# Patient Record
Sex: Male | Born: 1980 | Race: White | Hispanic: No | Marital: Single | Smoking: Never smoker
Health system: Southern US, Community
[De-identification: ages and names within clinical notes are randomized; demographics above are authoritative.]

---

## 2005-08-31 ENCOUNTER — Ambulatory Visit: Payer: Self-pay | Admitting: Internal Medicine

## 2015-01-27 ENCOUNTER — Ambulatory Visit (INDEPENDENT_AMBULATORY_CARE_PROVIDER_SITE_OTHER): Payer: BC Managed Care – PPO | Admitting: Family Medicine

## 2015-01-27 ENCOUNTER — Ambulatory Visit (INDEPENDENT_AMBULATORY_CARE_PROVIDER_SITE_OTHER): Payer: BC Managed Care – PPO

## 2015-01-27 VITALS — BP 112/76 | HR 82 | Temp 97.9°F | Resp 18 | Ht 66.0 in | Wt 208.2 lb

## 2015-01-27 DIAGNOSIS — R05 Cough: Secondary | ICD-10-CM | POA: Diagnosis not present

## 2015-01-27 DIAGNOSIS — R059 Cough, unspecified: Secondary | ICD-10-CM

## 2015-01-27 DIAGNOSIS — J189 Pneumonia, unspecified organism: Secondary | ICD-10-CM | POA: Diagnosis not present

## 2015-01-27 LAB — POCT CBC
Granulocyte percent: 64.4 %G (ref 37–80)
HCT, POC: 45.9 % (ref 43.5–53.7)
HEMOGLOBIN: 14.8 g/dL (ref 14.1–18.1)
LYMPH, POC: 1.4 (ref 0.6–3.4)
MCH, POC: 28.3 pg (ref 27–31.2)
MCHC: 32.1 g/dL (ref 31.8–35.4)
MCV: 88 fL (ref 80–97)
MID (cbc): 0.4 (ref 0–0.9)
MPV: 8.4 fL (ref 0–99.8)
POC GRANULOCYTE: 3.1 (ref 2–6.9)
POC LYMPH PERCENT: 28.3 %L (ref 10–50)
POC MID %: 7.3 %M (ref 0–12)
Platelet Count, POC: 238 10*3/uL (ref 142–424)
RBC: 5.22 M/uL (ref 4.69–6.13)
RDW, POC: 13.7 %
WBC: 4.8 10*3/uL (ref 4.6–10.2)

## 2015-01-27 MED ORDER — AZITHROMYCIN 500 MG PO TABS: 500.0000 mg | ORAL_TABLET | Freq: Every day | ORAL | Status: DC

## 2015-01-27 MED ORDER — HYDROCOD POLST-CPM POLST ER 10-8 MG/5ML PO SUER: 5.0000 mL | Freq: Every evening | ORAL | Status: DC | PRN

## 2015-01-27 MED ORDER — ALBUTEROL SULFATE 108 (90 BASE) MCG/ACT IN AEPB: 2.0000 | INHALATION_SPRAY | RESPIRATORY_TRACT | Status: DC | PRN

## 2015-01-27 MED ORDER — ALBUTEROL SULFATE (2.5 MG/3ML) 0.083% IN NEBU
2.5000 mg | INHALATION_SOLUTION | Freq: Once | RESPIRATORY_TRACT | Status: AC
  Administered 2015-01-27: 2.5 mg via RESPIRATORY_TRACT

## 2015-01-27 MED ORDER — IPRATROPIUM BROMIDE 0.02 % IN SOLN
0.5000 mg | Freq: Once | RESPIRATORY_TRACT | Status: AC
  Administered 2015-01-27: 0.5 mg via RESPIRATORY_TRACT

## 2015-01-27 MED ORDER — MUCINEX DM MAXIMUM STRENGTH 60-1200 MG PO TB12: 1.0000 | ORAL_TABLET | Freq: Two times a day (BID) | ORAL | Status: DC

## 2015-01-27 NOTE — Patient Instructions (Signed)

## 2015-01-27 NOTE — Progress Notes (Signed)
Subjective:  This chart was scribed for Todd Sorenson MD,  by Veverly Fells, at [facility]  This patient was seen in room 11 and the patient's care was started at 10:23 AM.    Patient ID: Todd Sweeney, male    DOB: 08/16/1980, 34 y.o.   MRN: [id number] Chief Complaint  Patient presents with  . Cough    expelled   . Chest Pain    pressure from cough     HPI  HPI Comments: Todd Sweeney is a 34 y.o. male who presents to the [facility] complaining of a productive cough (yellow phlegm)  onset 8 days ago with associated symptoms of chest pain and a fever (no longer present) .  Patient notes that his symptoms first started in his chest.  Patients dad smokes at home and patient used to smoke a long time ago but no longer does.  He has tried mucin ex and sudafed but denies any relief.  Patient does not have a history of asthma.  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     History reviewed. No pertinent past medical history.  No current outpatient prescriptions on file prior to visit.   No current facility-administered medications on file prior to visit.    No Known Allergies   Review of Systems  Constitutional: Positive for fever.  HENT: Negative for hearing loss and nosebleeds.   Eyes: Negative for pain, discharge and itching.  Respiratory: Positive for cough and wheezing.   Cardiovascular: Positive for chest pain.  Gastrointestinal: Negative for nausea and vomiting.  Musculoskeletal: Negative for gait problem, neck pain and neck stiffness.  Skin: Negative for rash.  Neurological: Negative for speech difficulty.       Objective:   Physical Exam  Constitutional: He is oriented to person, place, and time. He appears well-developed and well-nourished. No distress.  HENT:  Head: Normocephalic and atraumatic.  Both ears with retraction, erythema.   Eyes: No scleral icterus.  Neck: No thyromegaly present.    Pulmonary/Chest: Effort normal.  Left lung with a very faint expiratory wheeze Right lung is normal.   Lymphadenopathy:    He has no cervical adenopathy.  Neurological: He is alert and oriented to person, place, and time.  Skin: Skin is warm and dry. He is not diaphoretic.  Clammy and diaphoretic.   Psychiatric: He has a normal mood and affect. His behavior is normal.    Filed Vitals:   01/27/15 1007  BP: 112/76  Pulse: 82  Temp: 97.9 F (36.6 C)  TempSrc: Oral  Resp: 18  Height: 5\' 6"  (1.676 m)  Weight: 208 lb 3.2 oz (94.439 kg)  SpO2: 98%   [facility] (PRIMARY) x-ray report read by Dr. Clelia Croft:  No acute abnormality.  Diaphragm appear especially domed.   Results for orders placed or performed in visit on 01/27/15  POCT CBC  Result Value Ref Range   WBC 4.8 4.6 - 10.2 K/uL   Lymph, poc 1.4 0.6 - 3.4   POC LYMPH PERCENT 28.3 10 - 50 %L   MID (cbc) 0.4 0 - 0.9   POC MID % 7.3 0 - 12 %M   POC Granulocyte 3.1 2 - 6.9   Granulocyte percent 64.4 37 - 80 %G   RBC 5.22 4.69 - 6.13 M/uL   Hemoglobin 14.8 14.1 - 18.1 g/dL   HCT, POC 40.9 81.1 - 53.7 %   MCV  88.0 80 - 97 fL   MCH, POC 28.3 27 - 31.2 pg   MCHC 32.1 31.8 - 35.4 g/dL   RDW, POC 82.9 %   Platelet Count, POC 238 142 - 424 K/uL   MPV 8.4 0 - 99.8 fL       Assessment & Plan:   1. Cough   2. Walking pneumonia     Orders Placed This Encounter  Procedures  . DG Chest 2 View    Standing Status: Future     Number of Occurrences: 1     Standing Expiration Date: 01/27/2016    Order Specific Question:  Reason for Exam (SYMPTOM  OR DIAGNOSIS REQUIRED)    Answer:  concern for pna, left exp wheeze    Order Specific Question:  Preferred imaging location?    Answer:  External  . POCT CBC    Meds ordered this encounter  Medications  . albuterol (PROVENTIL) (2.5 MG/3ML) 0.083% nebulizer solution 2.5 mg    Sig:   . ipratropium (ATROVENT) nebulizer solution 0.5 mg    Sig:   . azithromycin (ZITHROMAX) 500 MG tablet     Sig: Take 1 tablet (500 mg total) by mouth daily.    Dispense:  3 tablet    Refill:  0  . chlorpheniramine-HYDROcodone (TUSSIONEX PENNKINETIC ER) 10-8 MG/5ML SUER    Sig: Take 5 mLs by mouth at bedtime as needed for cough.    Dispense:  90 mL    Refill:  0  . Dextromethorphan-Guaifenesin (MUCINEX DM MAXIMUM STRENGTH) 60-1200 MG TB12    Sig: Take 1 tablet by mouth every 12 (twelve) hours.    Dispense:  14 each    Refill:  0  . Albuterol Sulfate (PROAIR RESPICLICK) 108 (90 BASE) MCG/ACT AEPB    Sig: Inhale 2 puffs into the lungs every 4 (four) hours as needed (cough, shortness of breath, chest tightness, wheeze).    Dispense:  1 each    Refill:  0    Please dispense a spacer along with the inhaler if there is one that works. Pt needs instruction on its use. Has not used inhaler prior.    I personally performed the services described in this documentation, which was scribed in my presence. The recorded information has been reviewed and considered, and addended by me as needed.  Todd Sorenson, MD MPH

## 2015-02-19 ENCOUNTER — Ambulatory Visit: Payer: Worker's Compensation

## 2015-02-19 ENCOUNTER — Ambulatory Visit (INDEPENDENT_AMBULATORY_CARE_PROVIDER_SITE_OTHER): Payer: Worker's Compensation | Admitting: Emergency Medicine

## 2015-02-19 VITALS — BP 102/80 | HR 98 | Temp 98.7°F | Resp 18 | Ht 66.0 in | Wt 207.0 lb

## 2015-02-19 DIAGNOSIS — M25531 Pain in right wrist: Secondary | ICD-10-CM

## 2015-02-19 DIAGNOSIS — M79641 Pain in right hand: Secondary | ICD-10-CM

## 2015-02-19 DIAGNOSIS — M792 Neuralgia and neuritis, unspecified: Secondary | ICD-10-CM

## 2015-02-19 DIAGNOSIS — M5410 Radiculopathy, site unspecified: Secondary | ICD-10-CM

## 2015-02-19 MED ORDER — MELOXICAM 15 MG PO TABS: 7.5000 mg | ORAL_TABLET | Freq: Every day | ORAL | Status: DC
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
## 2015-02-19 NOTE — Progress Notes (Signed)
  Medical screening examination/treatment/procedure(s) were performed by non-physician practitioner and as supervising physician I was immediately available for consultation/collaboration.     

## 2016-02-02 ENCOUNTER — Ambulatory Visit (INDEPENDENT_AMBULATORY_CARE_PROVIDER_SITE_OTHER): Payer: BC Managed Care – PPO | Admitting: Family Medicine

## 2016-02-02 VITALS — BP 118/76 | HR 90 | Temp 98.0°F | Resp 16 | Ht 65.75 in | Wt 203.0 lb

## 2016-02-02 DIAGNOSIS — R238 Other skin changes: Secondary | ICD-10-CM

## 2016-02-02 DIAGNOSIS — Z23 Encounter for immunization: Secondary | ICD-10-CM

## 2016-02-02 MED ORDER — PERMETHRIN 5 % EX CREA: 1.0000 "application " | TOPICAL_CREAM | Freq: Once | CUTANEOUS | 0 refills | Status: AC

## 2016-02-02 NOTE — Patient Instructions (Addendum)
I do not think you have scabies or head lice.  I recommend the following: Zyrtec one tablet daily; Benadryl one tablet at bedtime.  Hydrocortisone cream three times daily.      IF you received an x-ray today, you will receive an invoice from [facility]. Please contact [facility] at [phone] with questions or concerns regarding your invoice.   IF you received labwork today, you will receive an invoice from [facility]. Please contact [facility] at [phone] with questions or concerns regarding your invoice.   Our billing staff will not be able to assist you with questions regarding bills from these companies.  You will be contacted with the lab results as soon as they are available. The fastest way to get your results is to activate your My Chart account. Instructions are located on the last page of this paperwork. If you have not heard from us regarding the results in 2 weeks, please contact this office.

## 2016-02-02 NOTE — Progress Notes (Signed)
By signing my name below, I, Mesha Guinyard, attest that this documentation has been prepared under the direction and in the presence of Nilda Simmer, MD.  Electronically Signed: Arvilla Market, Medical Scribe. 02/02/2016. 6:50 PM.  Subjective:    Patient ID: Todd Sweeney, male    DOB: 10/19/1980, 35 y.o.   MRN: [id number]  02/02/2016  Rash (back of neck)  HPI  HPI Comments: Todd Sweeney is a 35 y.o. male who presents to the [facility] complaining of worsening rash on the back of his neck onset 5 days ago. Pt reports it has spread to the left of his neck and reports minor itchiness, and mild tingling from the area. Pt states his neck was only exposed to the back of a chair while he was at the dentist, and suspects getting bug bites from there. 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 Pt states he's always stressed. Pt had chicken pox as a kid. Pt denies being outside or having bed bugs. Pt denies pain radiating to his arm, pain from the site, and itching in his hair.  Review of Systems  Constitutional: Negative for chills, diaphoresis, fatigue and fever.  Skin: Positive for color change and rash.  Psychiatric/Behavioral: The patient is nervous/anxious (stress).    No past medical history on file. No past surgical history on file. No Known Allergies Current Outpatient Prescriptions  Medication Sig Dispense Refill  . Albuterol Sulfate (PROAIR RESPICLICK) 108 (90 BASE) MCG/ACT AEPB Inhale 2 puffs into the lungs every 4 (four) hours as needed (cough, shortness of breath, chest tightness, wheeze). (Patient not taking: Reported on 02/02/2016) 1 each 0  . azithromycin (ZITHROMAX) 500 MG tablet Take 1 tablet (500 mg total) by mouth daily. (Patient not taking: Reported on 02/02/2016) 3 tablet 0  . chlorpheniramine-HYDROcodone (TUSSIONEX PENNKINETIC ER) 10-8 MG/5ML SUER Take 5 mLs by mouth at bedtime as needed for cough. (Patient not taking: Reported on  02/02/2016) 90 mL 0  . Dextromethorphan-Guaifenesin (MUCINEX DM MAXIMUM STRENGTH) 60-1200 MG TB12 Take 1 tablet by mouth every 12 (twelve) hours. (Patient not taking: Reported on 02/02/2016) 14 each 0  . meloxicam (MOBIC) 15 MG tablet Take 0.5-1 tablets (7.5-15 mg total) by mouth daily. (Patient not taking: Reported on 02/02/2016) 30 tablet 0   No current facility-administered medications for this visit.    Social History   Social History  . Marital status: Single    Spouse name: N/A  . Number of children: N/A  . Years of education: N/A   Occupational History  . Not on file.   Social History Main Topics  . Smoking status: Never Smoker  . Smokeless tobacco: Not on file  . Alcohol use Not on file  . Drug use: Unknown  . Sexual activity: Not on file   Other Topics Concern  . Not on file   Social History Narrative  . No narrative on file   No family history on file.   Objective:    BP 118/76 (BP Location: Right Arm, Patient Position: Sitting, Cuff Size: Normal)   Pulse 90   Temp 98 F (36.7 C) (Oral)   Resp 16   Ht 5' 5.75" (1.67 m)   Wt 203 lb (92.1 kg)   SpO2 97%   BMI 33.01 kg/m  Physical Exam  Constitutional: He appears well-developed and well-nourished. No distress.  HENT:  Head: Normocephalic and atraumatic.  Eyes: Conjunctivae are normal.  Neck: Neck supple.  Cardiovascular: Normal rate.   Pulmonary/Chest: Effort normal.  Neurological: He is alert.  Skin: Skin is warm and dry.  6 clusters of vesicular rashes on left posterior neck that does cross midline  Psychiatric: He has a normal mood and affect. His behavior is normal.  Nursing note and vitals reviewed.    Assessment & Plan:   1. Vesicular rash   2. Flu vaccine need    -New; ddx includes contact dermatitis versus allergic reaction to bug bites.  No concern for scabies at this time yet patient very concerned about scabies.  Would be concerned about Zoster yet vesicles cross midline and onset of rash  five days ago; thus, outside of ideal timeframe for antiviral therapy. -agreeable to rx for promethrin cream from your head to toe, except your face, and wash it off in the morning.  -Take zyrtec daily, and benadryl at night.  Orders Placed This Encounter  Procedures  . Flu Vaccine QUAD 36+ mos IM   Meds ordered this encounter  Medications  . permethrin (ELIMITE) 5 % cream    Sig: Apply 1 application topically once.    Dispense:  60 g    Refill:  0    No Follow-up on file.  I personally performed the services described in this documentation, which was scribed in my presence. The recorded information has been reviewed and considered.  Kristi Paulita FujitaMartin Smith, M.D. [facility]  [place] [address] [place], [place]  [id number] [phone] phone [phone] fax

## 2016-02-03 ENCOUNTER — Ambulatory Visit: Payer: Self-pay

## 2016-10-26 ENCOUNTER — Ambulatory Visit (INDEPENDENT_AMBULATORY_CARE_PROVIDER_SITE_OTHER): Payer: Commercial Managed Care - HMO | Admitting: Neurology

## 2016-10-26 ENCOUNTER — Encounter (INDEPENDENT_AMBULATORY_CARE_PROVIDER_SITE_OTHER): Payer: Self-pay

## 2016-10-26 ENCOUNTER — Encounter: Payer: Self-pay | Admitting: Neurology

## 2016-10-26 VITALS — BP 115/75 | HR 69 | Ht 65.0 in | Wt 198.0 lb

## 2016-10-26 DIAGNOSIS — H8309 Labyrinthitis, unspecified ear: Secondary | ICD-10-CM | POA: Diagnosis not present

## 2016-10-26 NOTE — Progress Notes (Signed)
Reason for visit: Vertigo  Referring physician: Dr. Tamsen Roers Todd Sweeney is a 36 y.o. male  History of present illness:  Todd Sweeney is a 36 year old left-handed white male with a history of onset of vertigo that began around 10/17/2016. Onset was spontaneous in nature, the patient was using his telephone at the time, and he suddenly began to have vertigo associated with nausea. The vertigo has been persistent since that time, but has gradually improved. The patient went to urgent medical care, he was given meclizine which seemed to help some but did not completely eliminate the vertigo. He went back 2 days later and he was placed on amoxicillin and prednisone, he was felt to have some fluid behind the eardrums bilaterally. The patient was set up for MRI evaluation of the brain which was unremarkable. The disc was brought for my review. There is no evidence of significant sinusitis or evidence of mastoiditis. The patient has completed his prednisone today, he still on amoxicillin. He has been out of work since onset of symptoms. The patient denies any numbness or weakness of the face, arms, or legs. He denies any double vision, loss of vision, he denies any slurred speech or difficulty swallowing. He has developed some headaches over the last 2 or 3 days, but the headaches were not part of his symptom complex earlier. He does have a history of intermittent headaches that may occur on average once a week, he may take Tylenol for the headache. The headaches are associated with photophobia. The patient denies any family history of headache. The patient does report that his balance is slightly off associated with the dizziness. He feels better when he keeps his head still, the dizziness may occur with any head movement. He denies any syncope. He is sent to this office for further evaluation.  History reviewed. No pertinent past medical history.  History reviewed. No pertinent surgical  history.  History reviewed. No pertinent family history.  Social history:  reports that he has never smoked. He has never used smokeless tobacco. His alcohol and drug histories are not on file.  Medications:  Prior to Admission medications   Medication Sig Start Date End Date Taking? Authorizing Provider  Albuterol Sulfate (PROAIR RESPICLICK) 108 (90 BASE) MCG/ACT AEPB Inhale 2 puffs into the lungs every 4 (four) hours as needed (cough, shortness of breath, chest tightness, wheeze). Patient not taking: Reported on 02/02/2016 01/27/15   Sherren Mocha, MD  azithromycin (ZITHROMAX) 500 MG tablet Take 1 tablet (500 mg total) by mouth daily. Patient not taking: Reported on 02/02/2016 01/27/15   Sherren Mocha, MD  chlorpheniramine-HYDROcodone [facility] PENNKINETIC ER) 10-8 MG/5ML SUER Take 5 mLs by mouth at bedtime as needed for cough. Patient not taking: Reported on 02/02/2016 01/27/15   Sherren Mocha, MD  Dextromethorphan-Guaifenesin [facility] DM MAXIMUM STRENGTH) 60-1200 MG TB12 Take 1 tablet by mouth every 12 (twelve) hours. Patient not taking: Reported on 02/02/2016 01/27/15   Sherren Mocha, MD  meloxicam (MOBIC) 15 MG tablet Take 0.5-1 tablets (7.5-15 mg total) by mouth daily. Patient not taking: Reported on 02/02/2016 02/19/15   Ofilia Neas, PA-C     No Known Allergies  ROS:  Out of a complete 14 system review of symptoms, the patient complains only of the following symptoms, and all other reviewed systems are negative.  Fatigue Dizziness, vertigo Eye pain Confusion, headache   Blood pressure 115/75, pulse 69, height 5\' 5"  (1.651 m), weight 198 lb (89.8  kg).   Blood pressure, lying is 1:15/75, heart rate 65. Blood pressure sitting is 123/87, heart rate 74. Blood pressure standing is 120/89, heart rate 69.  Physical Exam  General: The patient is alert and cooperative at the time of the examination.  Eyes: Pupils are equal, round, and reactive to light. Discs are flat bilaterally.  Ears:  Tympanic membranes are clear bilaterally.  Neck: The neck is supple, no carotid bruits are noted.  Respiratory: The respiratory examination is clear.  Cardiovascular: The cardiovascular examination reveals a regular rate and rhythm, no obvious murmurs or rubs are noted.  Skin: Extremities are without significant edema.  Neurologic Exam  Mental status: The patient is alert and oriented x 3 at the time of the examination. The patient has apparent normal recent and remote memory, with an apparently normal attention span and concentration ability.  Cranial nerves: Facial symmetry is present. There is good sensation of the face to pinprick and soft touch bilaterally. The strength of the facial muscles and the muscles to head turning and shoulder shrug are normal bilaterally. Speech is well enunciated, no aphasia or dysarthria is noted. Extraocular movements are full. Visual fields are full. The tongue is midline, and the patient has symmetric elevation of the soft palate. No obvious hearing deficits are noted.  Motor: The motor testing reveals 5 over 5 strength of all 4 extremities. Good symmetric motor tone is noted throughout.  Sensory: Sensory testing is intact to pinprick, soft touch, vibration sensation, and position sense on all 4 extremities. No evidence of extinction is noted.  Coordination: Cerebellar testing reveals good finger-nose-finger and heel-to-shin bilaterally. The Nyan-Barrany procedure was completely negative. The patient had no nystagmus and no subjective symptoms of dizziness.  Gait and station: Gait is normal. Tandem gait is normal. Romberg is negative. No drift is seen.  Reflexes: Deep tendon reflexes are symmetric and normal bilaterally. Toes are downgoing bilaterally.   Assessment/Plan:  1. Probable vestibulitis   The patient likely had an episode of vestibulitis that has improved over time. I have written a note to keep him out of work until 11/03/2016. The  patient will be completing his prednisone today and he will complete the antibiotic therapy. I do not see any evidence of otitis media currently on clinical examination. The patient will follow-up through this office on an as-needed basis, he will contact me if the symptoms persist.   Marlan Palau. Keith Kellin Bartling MD 10/26/2016 7:55 AM  [facility] [address] [place], [place] [id number]  Phone [phone] Fax [phone]

## 2016-11-30 ENCOUNTER — Ambulatory Visit: Payer: Self-pay | Admitting: Neurology

## 2017-07-14 IMAGING — CR DG WRIST COMPLETE 3+V*R*
4 series · 4 of 4 positions shown · non-contrast
Comparison: None.

CLINICAL DATA: Right wrist and hand pain.

EXAM:
RIGHT HAND - COMPLETE 3+ VIEW; RIGHT WRIST - COMPLETE 3+ VIEW

[PA]
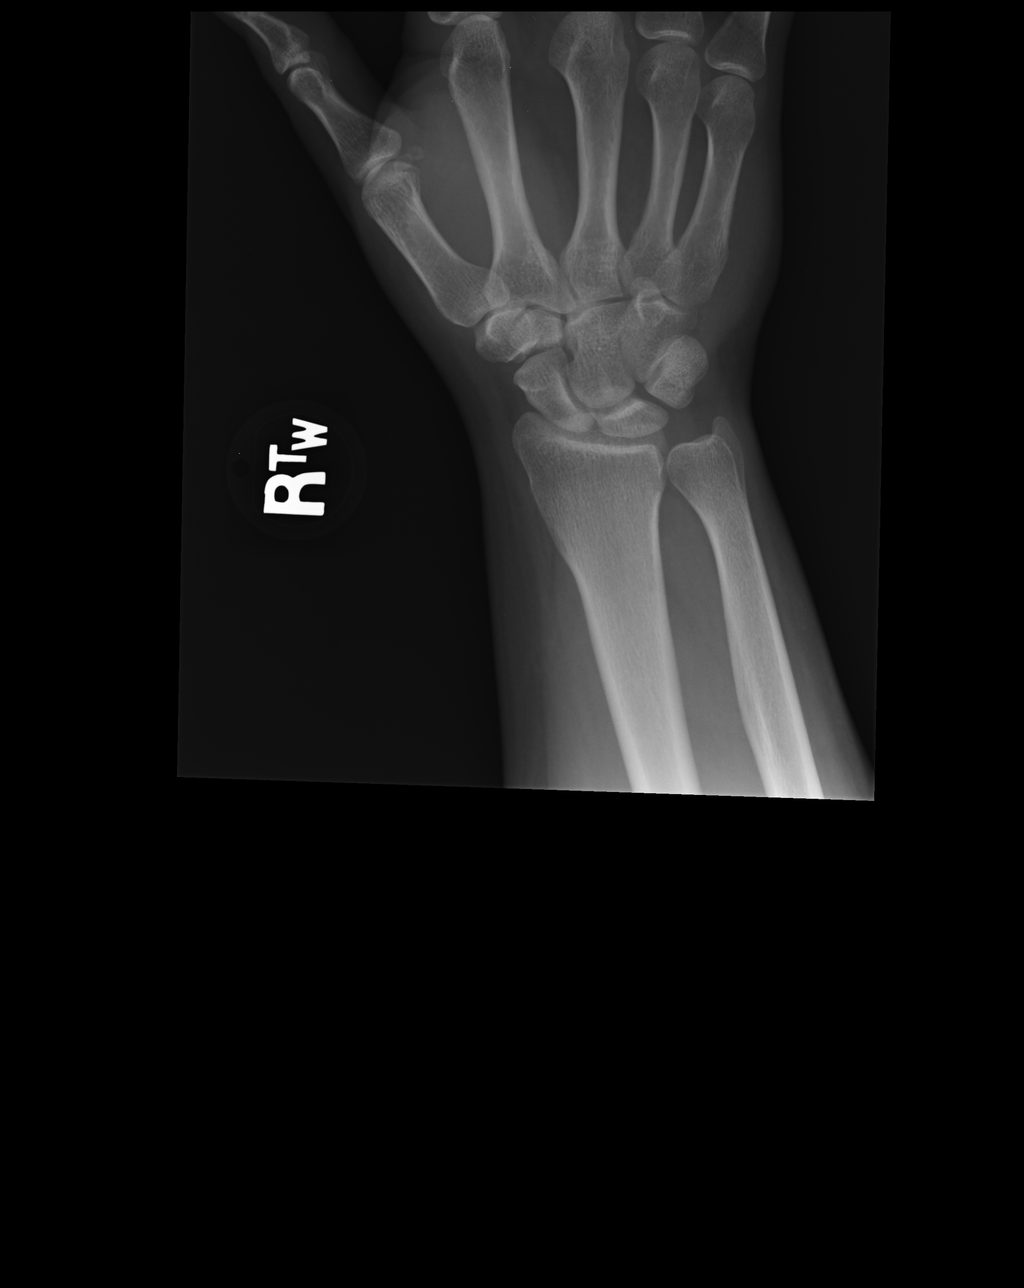

[lateral]
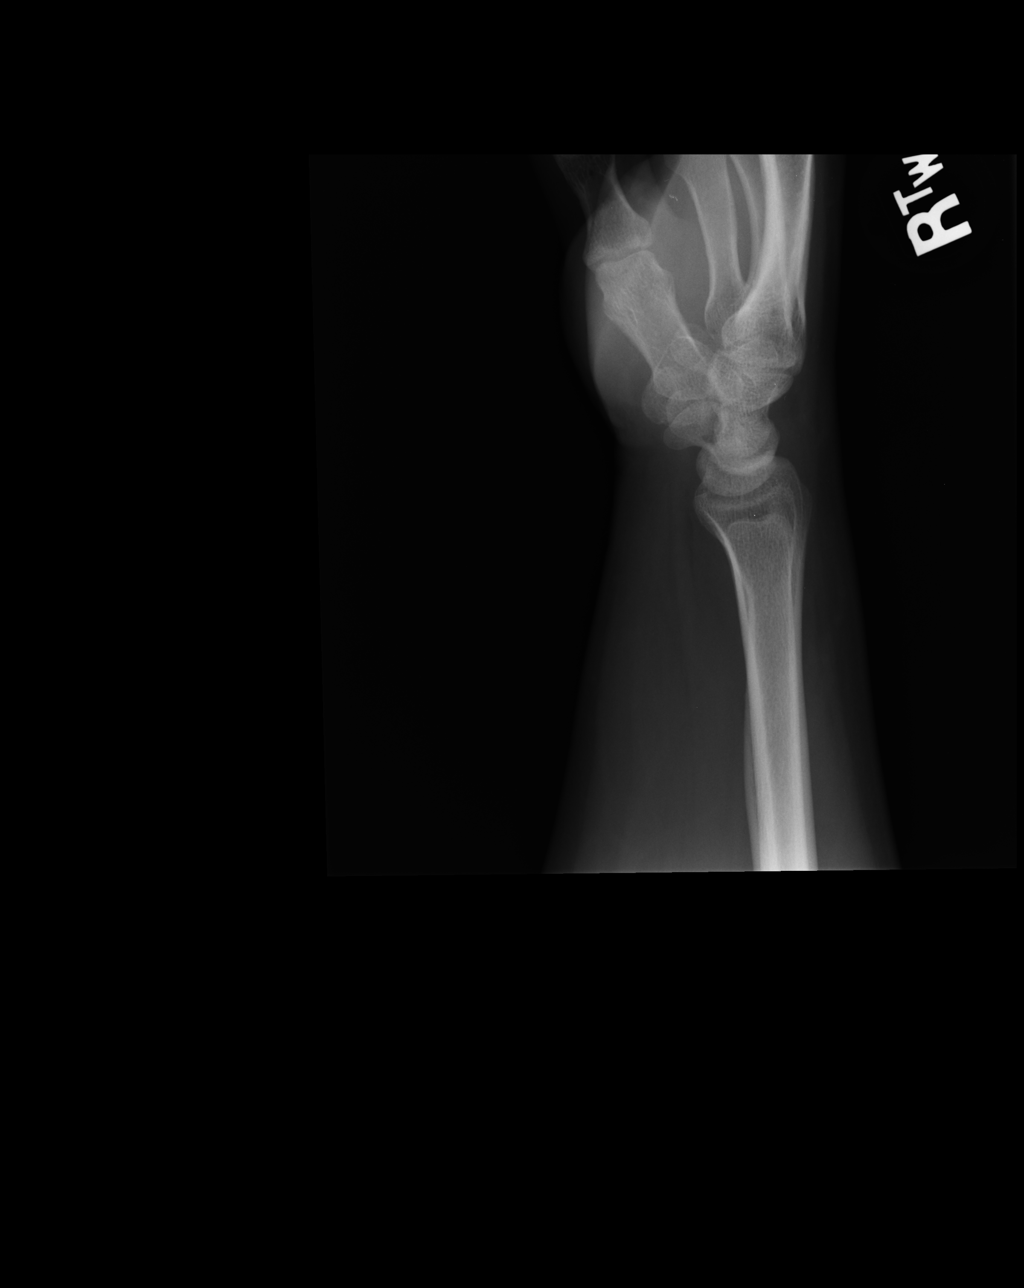

[ap ext rot]
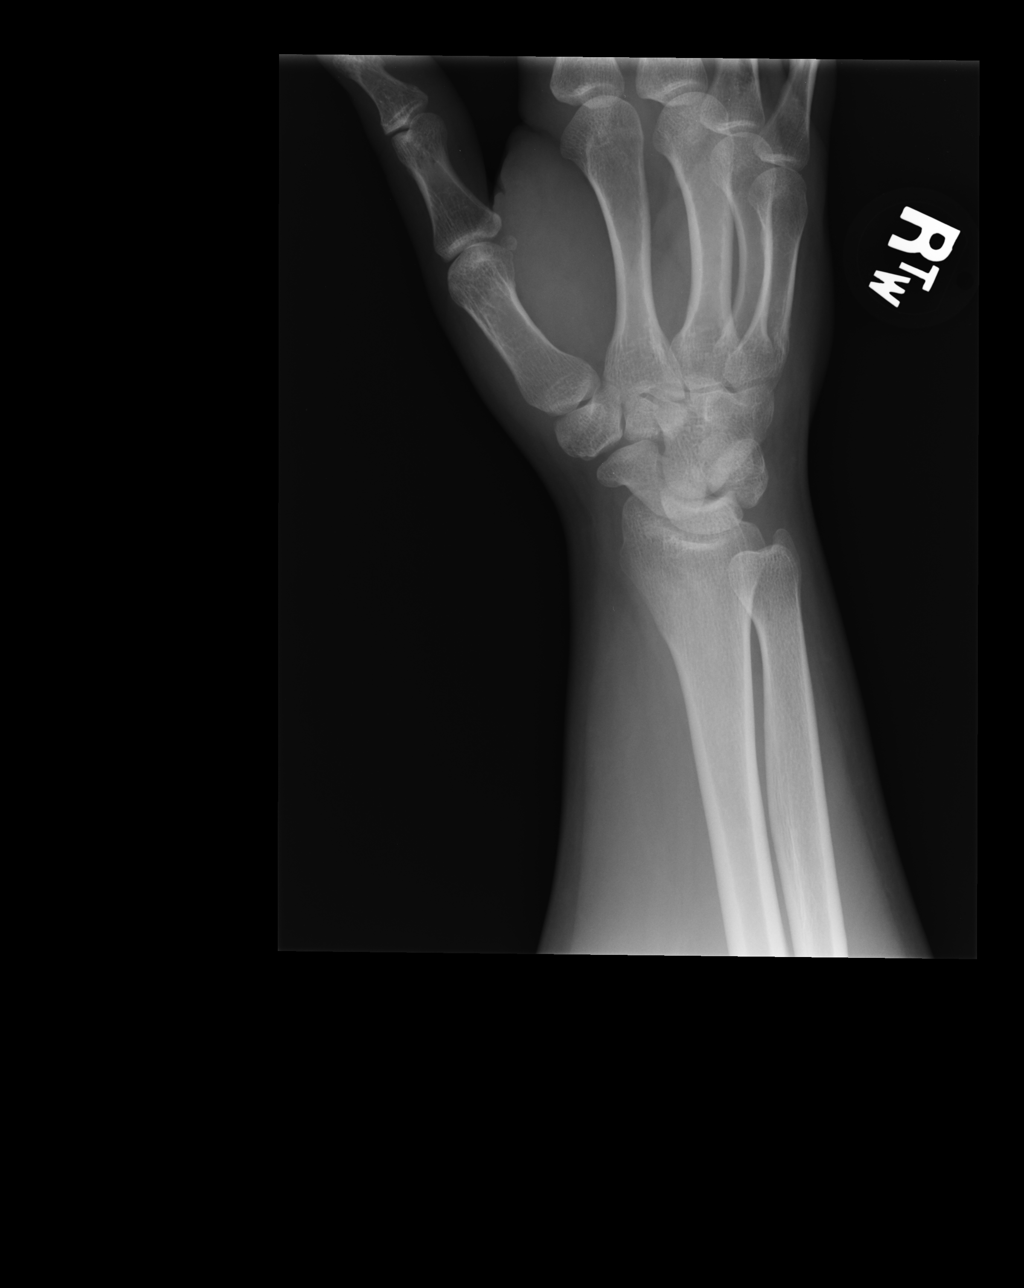

[pa navicular]
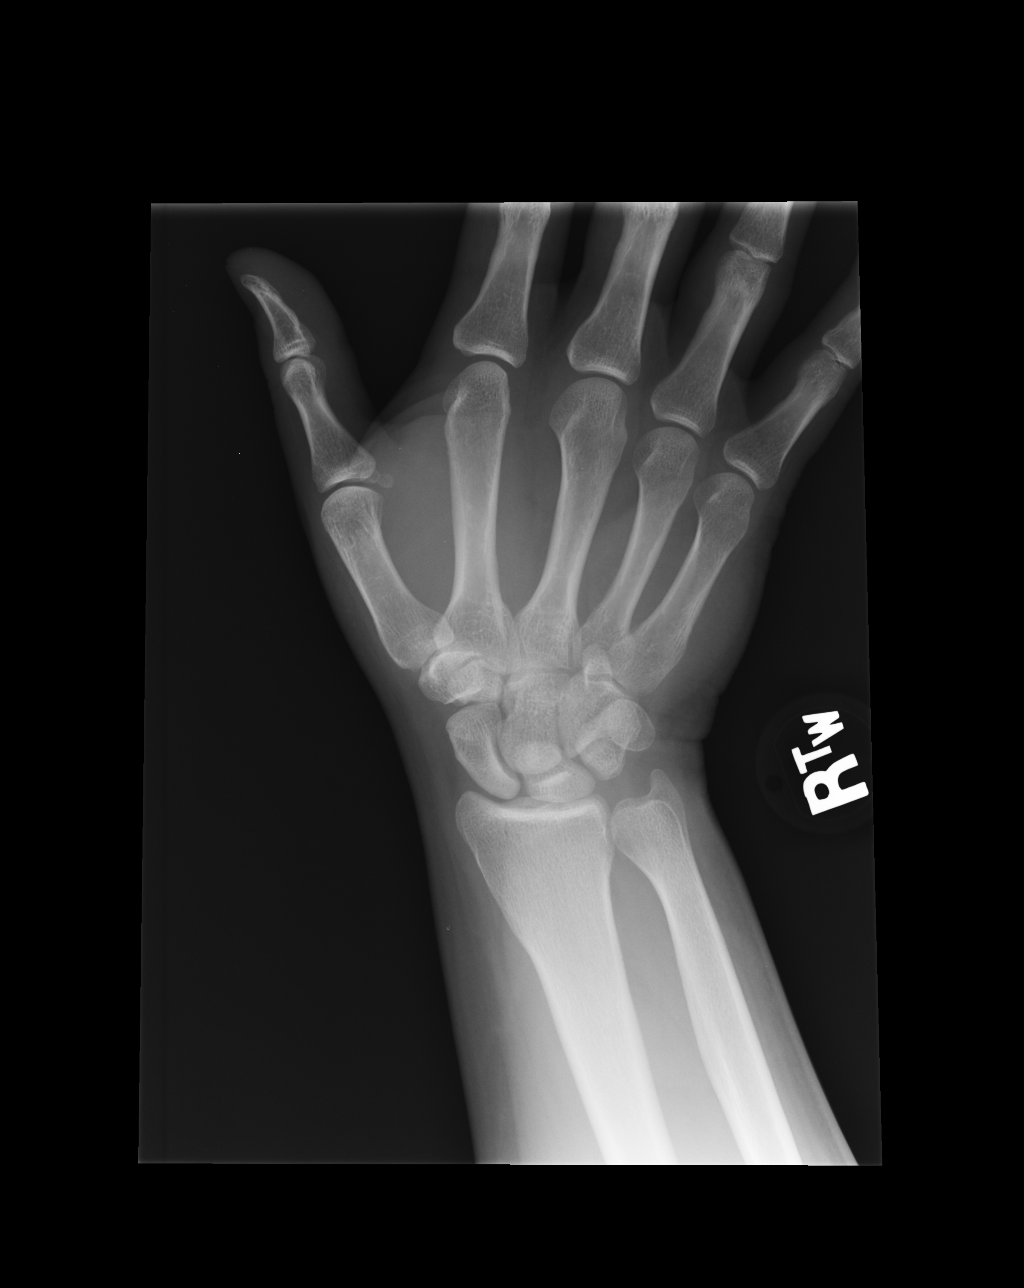

[4 of 4 positions shown; findings below may reference images not displayed]

FINDINGS: There is no evidence of fracture or dislocation. There is no
evidence of arthropathy or other focal bone abnormality. Soft
tissues are unremarkable.
IMPRESSION: No acute osseous injury of the right hand.

No acute osseous injury of the right wrist.

## 2017-10-05 ENCOUNTER — Encounter: Payer: Self-pay | Admitting: Family Medicine
